# Patient Record
Sex: Female | Born: 1990 | Race: White | Marital: Single | Smoking: Never smoker
Health system: Southern US, Community
[De-identification: ages and names within clinical notes are randomized; demographics above are authoritative.]

---
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
## 2014-08-09 ENCOUNTER — Encounter: Payer: Self-pay | Admitting: Obstetrics & Gynecology

## 2014-08-09 ENCOUNTER — Other Ambulatory Visit: Payer: Self-pay | Admitting: Obstetrics & Gynecology

## 2014-08-09 ENCOUNTER — Ambulatory Visit (INDEPENDENT_AMBULATORY_CARE_PROVIDER_SITE_OTHER): Payer: Medicaid Other | Admitting: Obstetrics & Gynecology

## 2014-08-09 ENCOUNTER — Other Ambulatory Visit (HOSPITAL_COMMUNITY)
Admission: RE | Admit: 2014-08-09 | Discharge: 2014-08-09 | Disposition: A | Discharge status: Home / Self Care | Payer: Medicaid Other | Source: Ambulatory Visit | Attending: Obstetrics & Gynecology | Admitting: Obstetrics & Gynecology

## 2014-08-09 VITALS — BP 116/78 | HR 88 | Ht 62.0 in | Wt 183.0 lb

## 2014-08-09 DIAGNOSIS — Z113 Encounter for screening for infections with a predominantly sexual mode of transmission: Secondary | ICD-10-CM | POA: Diagnosis present

## 2014-08-09 DIAGNOSIS — E669 Obesity, unspecified: Secondary | ICD-10-CM

## 2014-08-09 DIAGNOSIS — Z01419 Encounter for gynecological examination (general) (routine) without abnormal findings: Secondary | ICD-10-CM | POA: Diagnosis present

## 2014-08-09 DIAGNOSIS — Z3401 Encounter for supervision of normal first pregnancy, first trimester: Secondary | ICD-10-CM

## 2014-08-09 DIAGNOSIS — Z3682 Encounter for antenatal screening for nuchal translucency: Secondary | ICD-10-CM

## 2014-08-09 DIAGNOSIS — O99210 Obesity complicating pregnancy, unspecified trimester: Secondary | ICD-10-CM | POA: Insufficient documentation

## 2014-08-09 NOTE — Progress Notes (Signed)
Bedside ultrasound show CRL of 8 wks 0 days.  This is consistent with LMP dating within 3 days.

## 2014-08-09 NOTE — Progress Notes (Signed)
   Subjective:    Crystal Gilbert is a SW G1P0 933w3d by her LMP being seen today for her first obstetrical visit.  Her obstetrical history is significant for obesity. Patient does intend to breast feed. Pregnancy history fully reviewed.  Patient reports nausea, occasional vomitting, some breast soreness.  Filed Vitals:   08/09/14 1054 08/09/14 1055  BP: 116/78   Pulse: 88   Height:  5\' 2"  (1.575 m)  Weight: 183 lb (83.008 kg)     HISTORY: OB History  Gravida Para Term Preterm AB SAB TAB Ectopic Multiple Living  1             # Outcome Date GA Lbr Len/2nd Weight Sex Delivery Anes PTL Lv  1 CUR              History reviewed. No pertinent past medical history. History reviewed. No pertinent past surgical history. Family History  Problem Relation Age of Onset  . Cancer Mother     breast  . Diabetes Mother   . Hypertension Mother   . Diabetes Father   . Hypertension Father      Exam    Uterus:     Pelvic Exam:    Perineum: No Hemorrhoids   Vulva: normal   Vagina:  normal mucosa   pH:    Cervix: anteverted   Adnexa: normal adnexa   Bony Pelvis: android  System: Breast:  normal appearance, no masses or tenderness   Skin: normal coloration and turgor, no rashes    Neurologic: oriented   Extremities: normal strength, tone, and muscle mass   HEENT PERRLA   Mouth/Teeth mucous membranes moist, pharynx normal without lesions   Neck supple   Cardiovascular: regular rate and rhythm   Respiratory:  appears well, vitals normal, no respiratory distress, acyanotic, normal RR, ear and throat exam is normal, neck free of mass or lymphadenopathy, chest clear, no wheezing, crepitations, rhonchi, normal symmetric air entry   Abdomen: soft, non-tender; bowel sounds normal; no masses,  no organomegaly   Urinary: urethral meatus normal      Assessment:    Pregnancy: G1P0 There are no active problems to display for this patient.       Plan:     Initial labs  drawn. Prenatal vitamins. Problem list reviewed and updated. Genetic Screening discussed First Screen: ordered.  Ultrasound discussed; fetal survey: requested.  Follow up in 4 weeks. Due to obesity, I will rec 25# weight gain, early glucola and she is willing to see a nutritionist. Kaylor Simenson C. 08/09/2014

## 2014-08-10 LAB — OBSTETRIC PANEL
Antibody Screen: NEGATIVE
Basophils Absolute: 0 10*3/uL (ref 0.0–0.1)
Basophils Relative: 0 % (ref 0–1)
Eosinophils Absolute: 0.1 10*3/uL (ref 0.0–0.7)
Eosinophils Relative: 1 % (ref 0–5)
HCT: 38.6 % (ref 36.0–46.0)
Hemoglobin: 13.5 g/dL (ref 12.0–15.0)
Hepatitis B Surface Ag: NEGATIVE
Lymphocytes Relative: 15 % (ref 12–46)
Lymphs Abs: 1.4 10*3/uL (ref 0.7–4.0)
MCH: 30.8 pg (ref 26.0–34.0)
MCHC: 35 g/dL (ref 30.0–36.0)
MCV: 88.1 fL (ref 78.0–100.0)
Monocytes Absolute: 0.7 10*3/uL (ref 0.1–1.0)
Monocytes Relative: 7 % (ref 3–12)
Neutro Abs: 7.2 10*3/uL (ref 1.7–7.7)
Neutrophils Relative %: 77 % (ref 43–77)
Platelets: 280 10*3/uL (ref 150–400)
RBC: 4.38 MIL/uL (ref 3.87–5.11)
RDW: 12.3 % (ref 11.5–15.5)
Rh Type: NEGATIVE
Rubella: 0.9 {index} — ABNORMAL HIGH (ref <0.90)
WBC: 9.4 10*3/uL (ref 4.0–10.5)

## 2014-08-10 LAB — HIV ANTIBODY (ROUTINE TESTING W REFLEX): HIV 1&2 Ab, 4th Generation: NONREACTIVE

## 2014-08-11 LAB — CULTURE, OB URINE: Colony Count: 3000

## 2014-08-13 LAB — CYTOLOGY - PAP

## 2014-09-06 ENCOUNTER — Ambulatory Visit (INDEPENDENT_AMBULATORY_CARE_PROVIDER_SITE_OTHER): Payer: Medicaid Other | Admitting: Obstetrics & Gynecology

## 2014-09-06 ENCOUNTER — Ambulatory Visit (HOSPITAL_COMMUNITY)
Admission: RE | Admit: 2014-09-06 | Discharge: 2014-09-06 | Disposition: A | Discharge status: Home / Self Care | Payer: Medicaid Other | Source: Ambulatory Visit | Attending: Obstetrics & Gynecology | Admitting: Obstetrics & Gynecology

## 2014-09-06 ENCOUNTER — Encounter (HOSPITAL_COMMUNITY): Payer: Self-pay

## 2014-09-06 VITALS — BP 119/64 | HR 79 | Wt 185.2 lb

## 2014-09-06 VITALS — BP 125/79 | HR 91 | Wt 185.0 lb

## 2014-09-06 DIAGNOSIS — Z3401 Encounter for supervision of normal first pregnancy, first trimester: Secondary | ICD-10-CM

## 2014-09-06 DIAGNOSIS — Z3A12 12 weeks gestation of pregnancy: Secondary | ICD-10-CM | POA: Diagnosis not present

## 2014-09-06 DIAGNOSIS — Z3682 Encounter for antenatal screening for nuchal translucency: Secondary | ICD-10-CM

## 2014-09-06 DIAGNOSIS — Z36 Encounter for antenatal screening of mother: Secondary | ICD-10-CM | POA: Diagnosis not present

## 2014-09-06 IMAGING — US US MFM FETAL NUCHAL TRANSLUCENCY
1 series · 13 of 27 positions shown · non-contrast
Comparison: none

[Series 1: us mfm fetal nuchal translucency · 0.21mm/px · 13 of 27 slices shown]
[im 2/27]
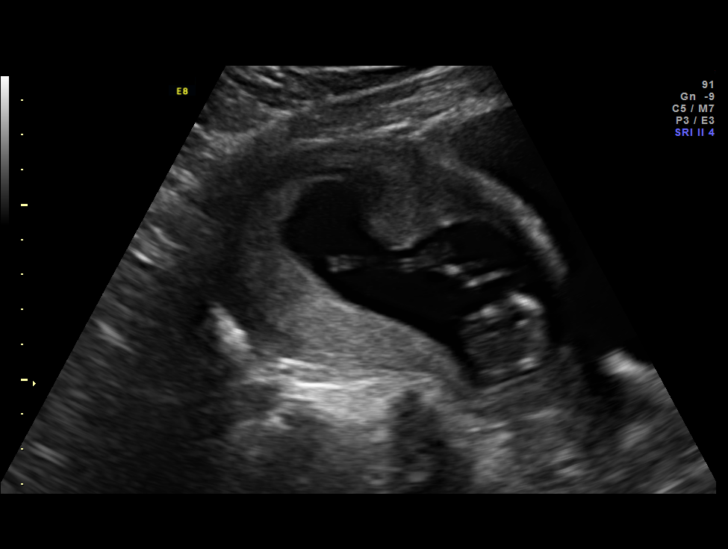
[im 4/27]
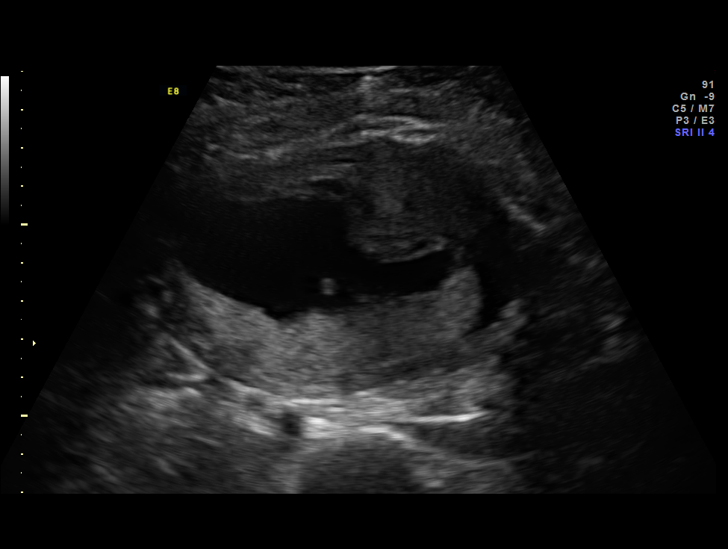
[im 6/27]
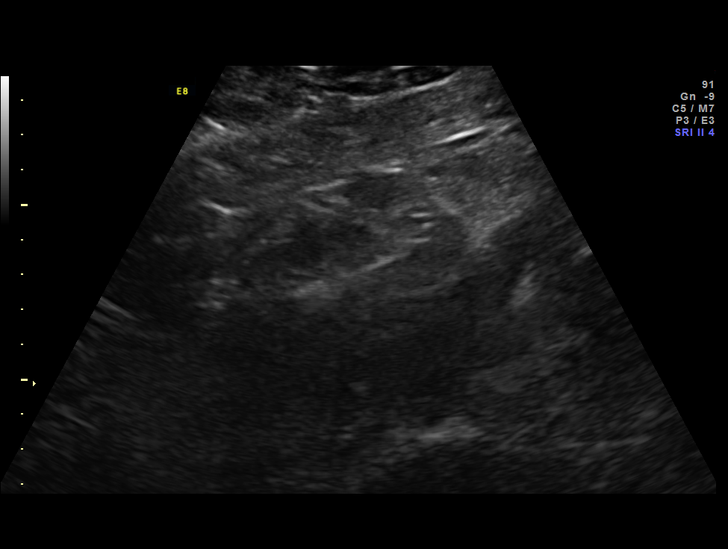
[im 8/27]
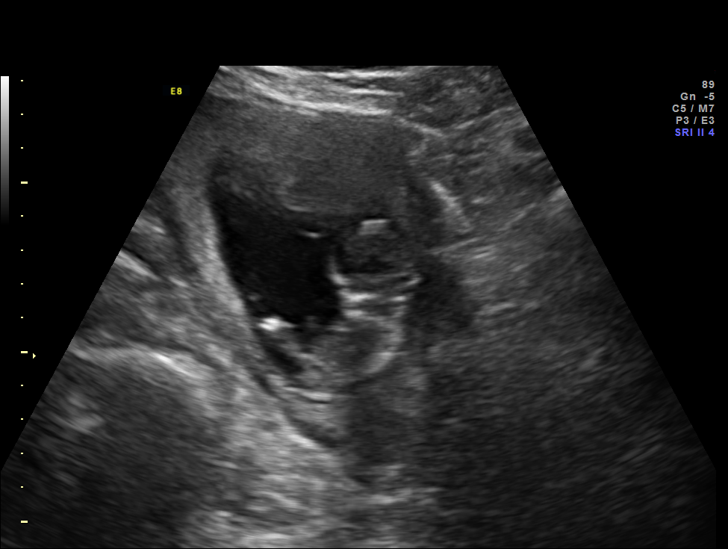
[im 10/27]
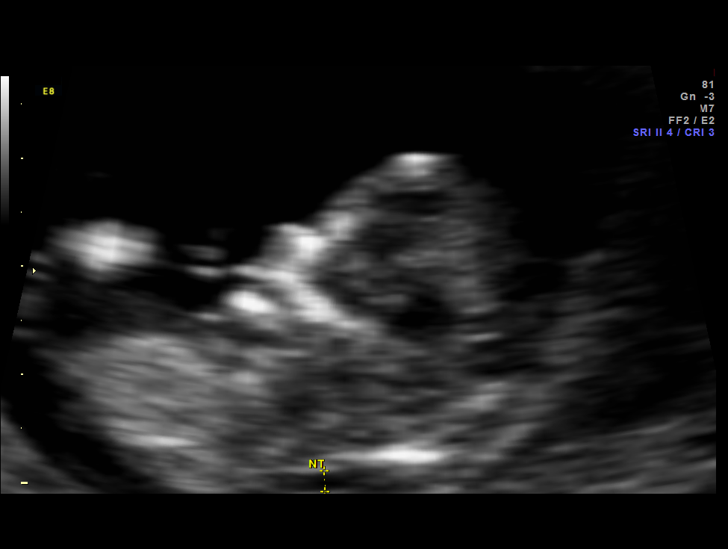
[im 12/27]
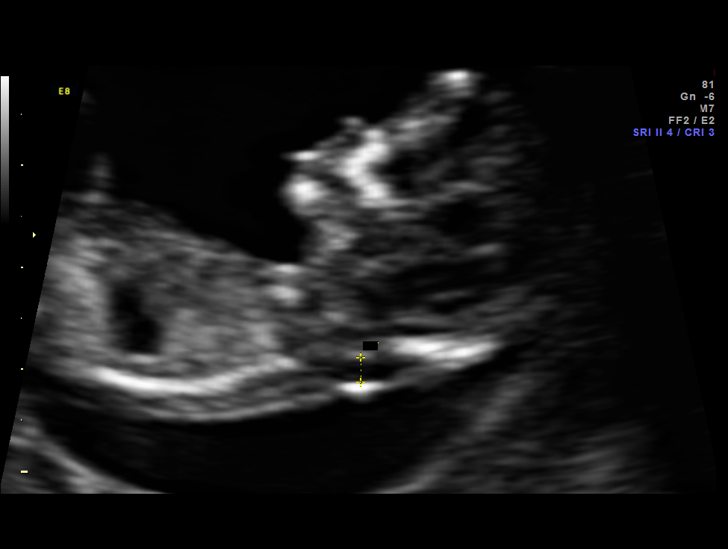
[im 14/27]
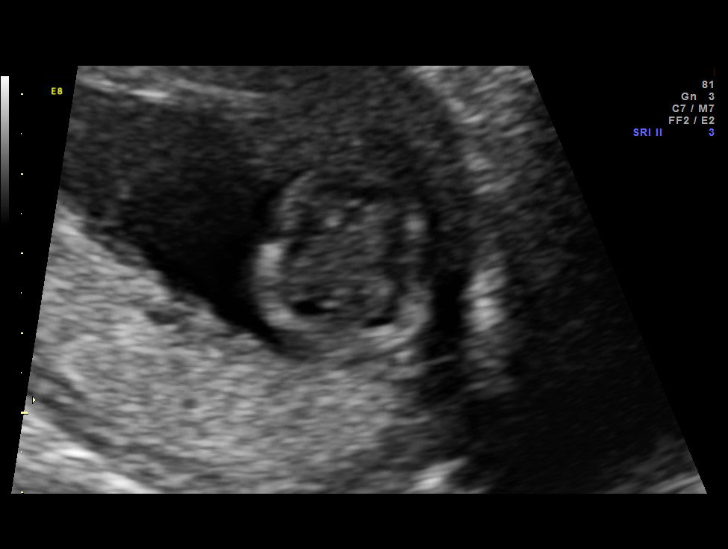
[im 16/27]
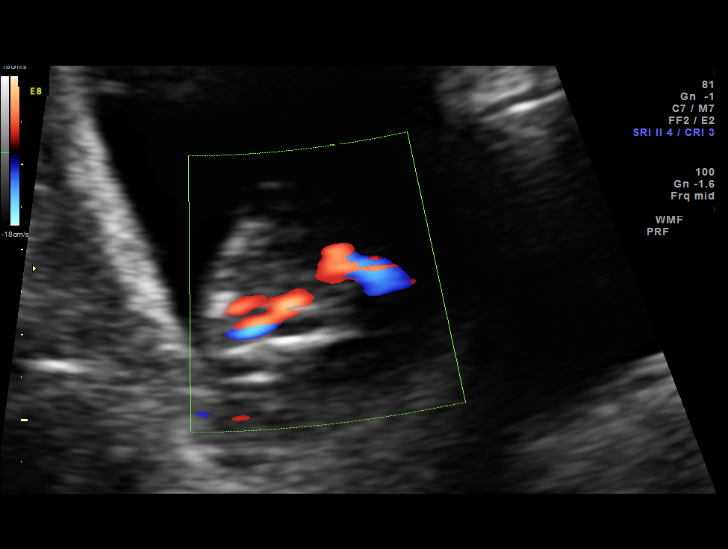
[im 18/27]
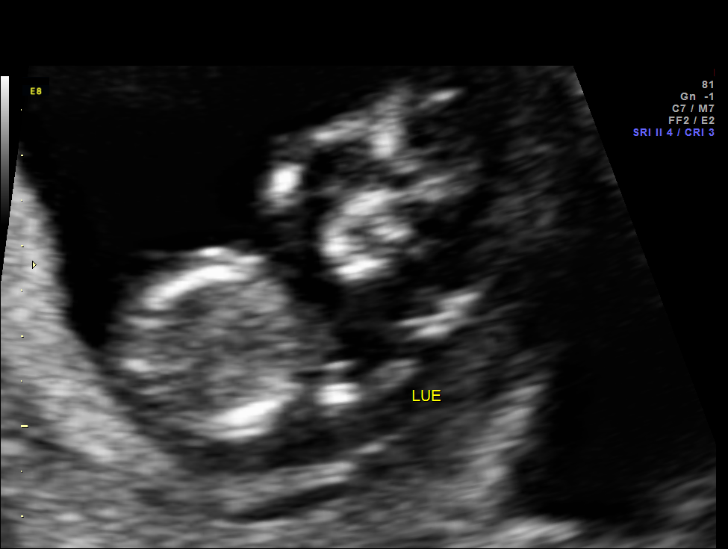
[im 20/27]
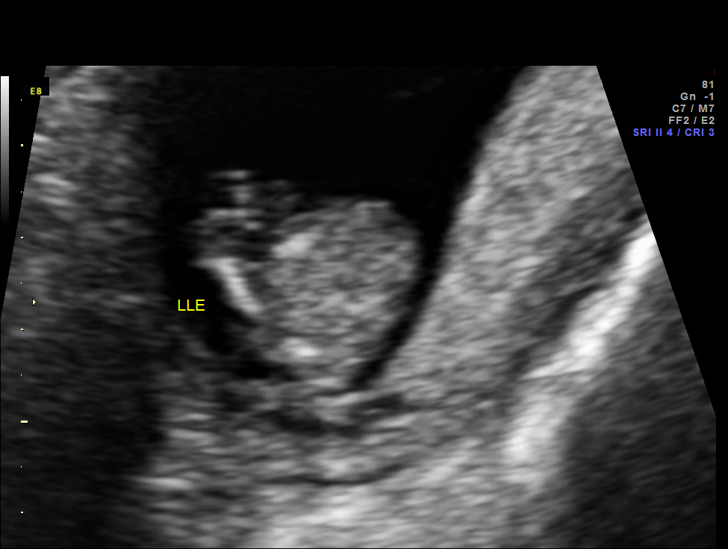
[im 22/27]
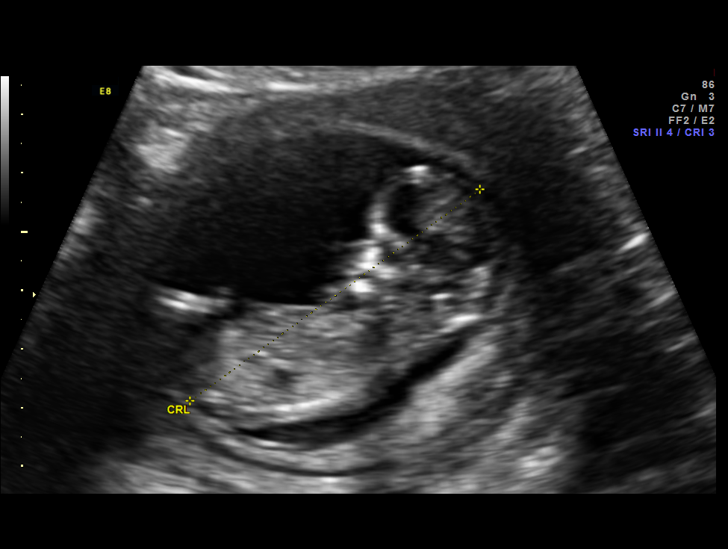
[im 24/27]
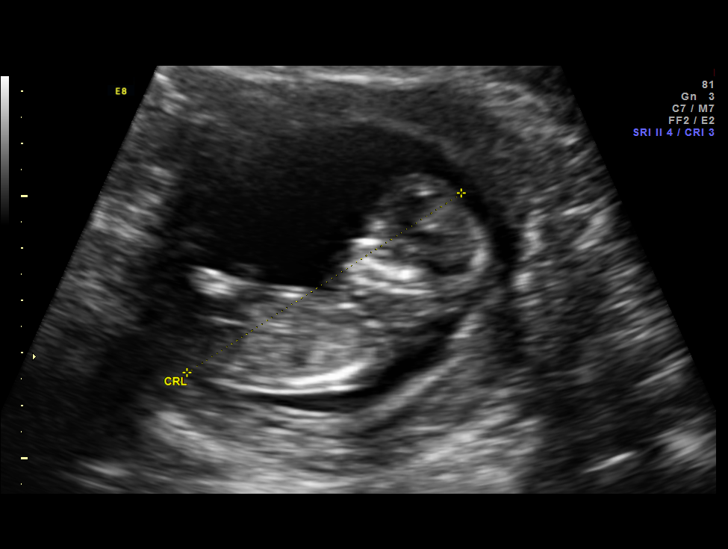
[im 26/27]
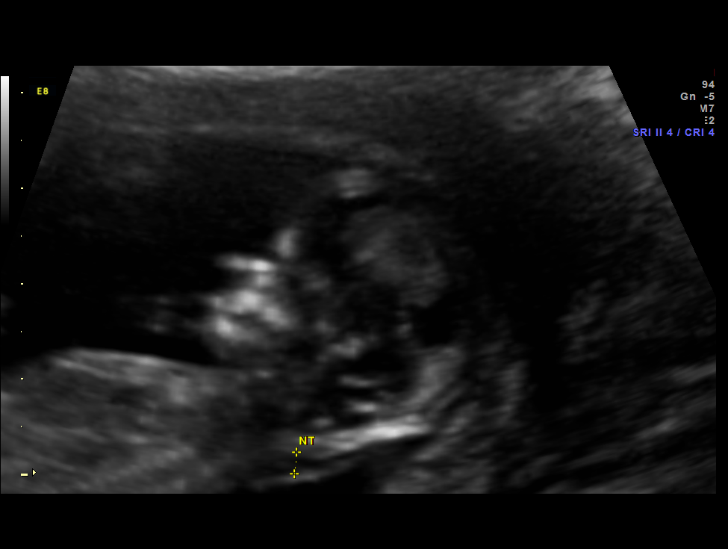

[13 of 27 positions shown; findings below may reference images not displayed]

OBSTETRICS REPORT
                      (Signed Final [DATE] [DATE])

Service(s) Provided

Indications

 First trimester aneuploidy screen (NT)                Z36
 12 weeks gestation of pregnancy
Fetal Evaluation

 Num Of Fetuses:    1
 Fetal Heart Rate:  145                          bpm
 Cardiac Activity:  Observed
 Presentation:      Transverse, head to
                    maternal left
 Placenta:          Posterior

 Amniotic Fluid
 AFI FV:      Subjectively within normal limits
Gestational Age

 LMP:           12w 3d        Date:  [DATE]                 EDD:   [DATE]
 Best:          12w 3d     Det. By:  LMP  ([DATE])          EDD:   [DATE]
1st Trimester Genetic Sonogram Screening

 CRL:            62.4  mm    G. Age:   12w 3d                 EDD:   [DATE]
 Nuc Trans:       2.3  mm

 Nasal Bone:                 Present
Anatomy

 Cranium:          Appears normal         Cord Vessels:     Appears normal (3
                                                            vessel cord)
 Choroid Plexus:   Appears normal         Lower             Visualized
                                          Extremities:
 Stomach:          Appears normal, left   Upper             Visualized
                   sided                  Extremities:
Cervix Uterus Adnexa

 Cervix:       Normal appearance by transabdominal scan.

 Adnexa:     No abnormality visualized.
Impression

 Single IUP at 12w 3d
 NT 2.3 mm.  Nasal bone visualized.
 First trimester aneuploidy screen performed as noted above.
Recommendations

 Please do not draw triple/quad screen, though patient should
 be offered [facility]SAFP for neural tube defect screening.
 Recommend ultrasound for fetal anatomy at 18-20 weeks
 gestaton

 questions or concerns.

## 2014-09-06 NOTE — Progress Notes (Signed)
Had two days of brown discharge around 10 wks, some lower back pain now.  Negative urine dip.

## 2014-09-06 NOTE — Progress Notes (Signed)
First trimester screen done 10/30, will follow up results and manage accordingly. Patient is moving to [place], has not found OB provider yet.  Will transfer records once she gets new OB provider.  For now, she will continue coming here. AFP only draw next visit.  Routine obstetric precautions reviewed.

## 2014-09-06 NOTE — Patient Instructions (Signed)
Return to clinic for any obstetric concerns or go to [facility] for evaluation  

## 2014-09-10 ENCOUNTER — Encounter (HOSPITAL_COMMUNITY): Payer: Self-pay

## 2014-09-26 ENCOUNTER — Ambulatory Visit: Payer: Medicaid Other | Admitting: Dietician

## 2014-10-01 ENCOUNTER — Other Ambulatory Visit (HOSPITAL_COMMUNITY): Payer: Self-pay

## 2014-10-11 ENCOUNTER — Ambulatory Visit (INDEPENDENT_AMBULATORY_CARE_PROVIDER_SITE_OTHER): Payer: Medicaid Other | Admitting: Family Medicine

## 2014-10-11 VITALS — BP 110/76 | HR 102 | Wt 187.2 lb

## 2014-10-11 DIAGNOSIS — O360121 Maternal care for anti-D [Rh] antibodies, second trimester, fetus 1: Secondary | ICD-10-CM

## 2014-10-11 DIAGNOSIS — Z6791 Unspecified blood type, Rh negative: Secondary | ICD-10-CM | POA: Insufficient documentation

## 2014-10-11 DIAGNOSIS — Z3401 Encounter for supervision of normal first pregnancy, first trimester: Secondary | ICD-10-CM

## 2014-10-11 DIAGNOSIS — O26899 Other specified pregnancy related conditions, unspecified trimester: Secondary | ICD-10-CM

## 2014-10-11 NOTE — Progress Notes (Signed)
Came back from [place] for PNV. Is having a hard time finding a provider in [place]. She is Rh negative--needs Rhogam Schedule anatomy

## 2014-10-11 NOTE — Patient Instructions (Signed)
Second Trimester of Pregnancy The second trimester is from week 13 through week 28, months 4 through 6. The second trimester is often a time when you feel your best. Your body has also adjusted to being pregnant, and you begin to feel better physically. Usually, morning sickness has lessened or quit completely, you may have more energy, and you may have an increase in appetite. The second trimester is also a time when the fetus is growing rapidly. At the end of the sixth month, the fetus is about 9 inches long and weighs about 1 pounds. You will likely begin to feel the baby move (quickening) between 18 and 20 weeks of the pregnancy. BODY CHANGES Your body goes through many changes during pregnancy. The changes vary from woman to woman.   Your weight will continue to increase. You will notice your lower abdomen bulging out.  You may begin to get stretch marks on your hips, abdomen, and breasts.  You may develop headaches that can be relieved by medicines approved by your health care provider.  You may urinate more often because the fetus is pressing on your bladder.  You may develop or continue to have heartburn as a result of your pregnancy.  You may develop constipation because certain hormones are causing the muscles that push waste through your intestines to slow down.  You may develop hemorrhoids or swollen, bulging veins (varicose veins).  You may have back pain because of the weight gain and pregnancy hormones relaxing your joints between the bones in your pelvis and as a result of a shift in weight and the muscles that support your balance.  Your breasts will continue to grow and be tender.  Your gums may bleed and may be sensitive to brushing and flossing.  Dark spots or blotches (chloasma, mask of pregnancy) may develop on your face. This will likely fade after the baby is born.  A dark line from your belly button to the pubic area (linea nigra) may appear. This will likely  fade after the baby is born.  You may have changes in your hair. These can include thickening of your hair, rapid growth, and changes in texture. Some women also have hair loss during or after pregnancy, or hair that feels dry or thin. Your hair will most likely return to normal after your baby is born. WHAT TO EXPECT AT YOUR PRENATAL VISITS During a routine prenatal visit:  You will be weighed to make sure you and the fetus are growing normally.  Your blood pressure will be taken.  Your abdomen will be measured to track your baby's growth.  The fetal heartbeat will be listened to.  Any test results from the previous visit will be discussed. Your health care provider may ask you:  How you are feeling.  If you are feeling the baby move.  If you have had any abnormal symptoms, such as leaking fluid, bleeding, severe headaches, or abdominal cramping.  If you have any questions. Other tests that may be performed during your second trimester include:  Blood tests that check for:  Low iron levels (anemia).  Gestational diabetes (between 24 and 28 weeks).  Rh antibodies.  Urine tests to check for infections, diabetes, or protein in the urine.  An ultrasound to confirm the proper growth and development of the baby.  An amniocentesis to check for possible genetic problems.  Fetal screens for spina bifida and Down syndrome. HOME CARE INSTRUCTIONS   Avoid all smoking, herbs, alcohol, and unprescribed   drugs. These chemicals affect the formation and growth of the baby.  Follow your health care provider's instructions regarding medicine use. There are medicines that are either safe or unsafe to take during pregnancy.  Exercise only as directed by your health care provider. Experiencing uterine cramps is a good sign to stop exercising.  Continue to eat regular, healthy meals.  Wear a good support bra for breast tenderness.  Do not use hot tubs, steam rooms, or saunas.  Wear  your seat belt at all times when driving.  Avoid raw meat, uncooked cheese, cat litter boxes, and soil used by cats. These carry germs that can cause birth defects in the baby.  Take your prenatal vitamins.  Try taking a stool softener (if your health care provider approves) if you develop constipation. Eat more high-fiber foods, such as fresh vegetables or fruit and whole grains. Drink plenty of fluids to keep your urine clear or pale yellow.  Take warm sitz baths to soothe any pain or discomfort caused by hemorrhoids. Use hemorrhoid cream if your health care provider approves.  If you develop varicose veins, wear support hose. Elevate your feet for 15 minutes, 3-4 times a day. Limit salt in your diet.  Avoid heavy lifting, wear low heel shoes, and practice good posture.  Rest with your legs elevated if you have leg cramps or low back pain.  Visit your dentist if you have not gone yet during your pregnancy. Use a soft toothbrush to brush your teeth and be gentle when you floss.  A sexual relationship may be continued unless your health care provider directs you otherwise.  Continue to go to all your prenatal visits as directed by your health care provider. SEEK MEDICAL CARE IF:   You have dizziness.  You have mild pelvic cramps, pelvic pressure, or nagging pain in the abdominal area.  You have persistent nausea, vomiting, or diarrhea.  You have a bad smelling vaginal discharge.  You have pain with urination. SEEK IMMEDIATE MEDICAL CARE IF:   You have a fever.  You are leaking fluid from your vagina.  You have spotting or bleeding from your vagina.  You have severe abdominal cramping or pain.  You have rapid weight gain or loss.  You have shortness of breath with chest pain.  You notice sudden or extreme swelling of your face, hands, ankles, feet, or legs.  You have not felt your baby move in over an hour.  You have severe headaches that do not go away with  medicine.  You have vision changes. Document Released: 10/19/2001 Document Revised: 10/30/2013 Document Reviewed: 12/26/2012 [employer] Patient Information 2015 [employer]. This information is not intended to replace advice given to you by your health care provider. Make sure you discuss any questions you have with your health care provider.  Breastfeeding Deciding to breastfeed is one of the best choices you can make for you and your baby. A change in hormones during pregnancy causes your breast tissue to grow and increases the number and size of your milk ducts. These hormones also allow proteins, sugars, and fats from your blood supply to make breast milk in your milk-producing glands. Hormones prevent breast milk from being released before your baby is born as well as prompt milk flow after birth. Once breastfeeding has begun, thoughts of your baby, as well as his or her sucking or crying, can stimulate the release of milk from your milk-producing glands.  BENEFITS OF BREASTFEEDING For Your Baby  Your first   milk (colostrum) helps your baby's digestive system function better.   There are antibodies in your milk that help your baby fight off infections.   Your baby has a lower incidence of asthma, allergies, and sudden infant death syndrome.   The nutrients in breast milk are better for your baby than infant formulas and are designed uniquely for your baby's needs.   Breast milk improves your baby's brain development.   Your baby is less likely to develop other conditions, such as childhood obesity, asthma, or type 2 diabetes mellitus.  For You   Breastfeeding helps to create a very special bond between you and your baby.   Breastfeeding is convenient. Breast milk is always available at the correct temperature and costs nothing.   Breastfeeding helps to burn calories and helps you lose the weight gained during pregnancy.   Breastfeeding makes your uterus contract to its  prepregnancy size faster and slows bleeding (lochia) after you give birth.   Breastfeeding helps to lower your risk of developing type 2 diabetes mellitus, osteoporosis, and breast or ovarian cancer later in life. SIGNS THAT YOUR BABY IS HUNGRY Early Signs of Hunger  Increased alertness or activity.  Stretching.  Movement of the head from side to side.  Movement of the head and opening of the mouth when the corner of the mouth or cheek is stroked (rooting).  Increased sucking sounds, smacking lips, cooing, sighing, or squeaking.  Hand-to-mouth movements.  Increased sucking of fingers or hands. Late Signs of Hunger  Fussing.  Intermittent crying. Extreme Signs of Hunger Signs of extreme hunger will require calming and consoling before your baby will be able to breastfeed successfully. Do not wait for the following signs of extreme hunger to occur before you initiate breastfeeding:   Restlessness.  A loud, strong cry.   Screaming. BREASTFEEDING BASICS Breastfeeding Initiation  Find a comfortable place to sit or lie down, with your neck and back well supported.  Place a pillow or rolled up blanket under your baby to bring him or her to the level of your breast (if you are seated). Nursing pillows are specially designed to help support your arms and your baby while you breastfeed.  Make sure that your baby's abdomen is facing your abdomen.   Gently massage your breast. With your fingertips, massage from your chest wall toward your nipple in a circular motion. This encourages milk flow. You may need to continue this action during the feeding if your milk flows slowly.  Support your breast with 4 fingers underneath and your thumb above your nipple. Make sure your fingers are well away from your nipple and your baby's mouth.   Stroke your baby's lips gently with your finger or nipple.   When your baby's mouth is open wide enough, quickly bring your baby to your breast,  placing your entire nipple and as much of the colored area around your nipple (areola) as possible into your baby's mouth.   More areola should be visible above your baby's upper lip than below the lower lip.   Your baby's tongue should be between his or her lower gum and your breast.   Ensure that your baby's mouth is correctly positioned around your nipple (latched). Your baby's lips should create a seal on your breast and be turned out (everted).  It is common for your baby to suck about 2-3 minutes in order to start the flow of breast milk. Latching Teaching your baby how to latch on to your breast   properly is very important. An improper latch can cause nipple pain and decreased milk supply for you and poor weight gain in your baby. Also, if your baby is not latched onto your nipple properly, he or she may swallow some air during feeding. This can make your baby fussy. Burping your baby when you switch breasts during the feeding can help to get rid of the air. However, teaching your baby to latch on properly is still the best way to prevent fussiness from swallowing air while breastfeeding. Signs that your baby has successfully latched on to your nipple:    Silent tugging or silent sucking, without causing you pain.   Swallowing heard between every 3-4 sucks.    Muscle movement above and in front of his or her ears while sucking.  Signs that your baby has not successfully latched on to nipple:   Sucking sounds or smacking sounds from your baby while breastfeeding.  Nipple pain. If you think your baby has not latched on correctly, slip your finger into the corner of your baby's mouth to break the suction and place it between your baby's gums. Attempt breastfeeding initiation again. Signs of Successful Breastfeeding Signs from your baby:   A gradual decrease in the number of sucks or complete cessation of sucking.   Falling asleep.   Relaxation of his or her body.    Retention of a small amount of milk in his or her mouth.   Letting go of your breast by himself or herself. Signs from you:  Breasts that have increased in firmness, weight, and size 1-3 hours after feeding.   Breasts that are softer immediately after breastfeeding.  Increased milk volume, as well as a change in milk consistency and color by the fifth day of breastfeeding.   Nipples that are not sore, cracked, or bleeding. Signs That Your Baby is Getting Enough Milk  Wetting at least 3 diapers in a 24-hour period. The urine should be clear and pale yellow by age 5 days.  At least 3 stools in a 24-hour period by age 5 days. The stool should be soft and yellow.  At least 3 stools in a 24-hour period by age 7 days. The stool should be seedy and yellow.  No loss of weight greater than 10% of birth weight during the first 3 days of age.  Average weight gain of 4-7 ounces (113-198 g) per week after age 4 days.  Consistent daily weight gain by age 5 days, without weight loss after the age of 2 weeks. After a feeding, your baby may spit up a small amount. This is common. BREASTFEEDING FREQUENCY AND DURATION Frequent feeding will help you make more milk and can prevent sore nipples and breast engorgement. Breastfeed when you feel the need to reduce the fullness of your breasts or when your baby shows signs of hunger. This is called "breastfeeding on demand." Avoid introducing a pacifier to your baby while you are working to establish breastfeeding (the first 4-6 weeks after your baby is born). After this time you may choose to use a pacifier. Research has shown that pacifier use during the first year of a baby's life decreases the risk of sudden infant death syndrome (SIDS). Allow your baby to feed on each breast as long as he or she wants. Breastfeed until your baby is finished feeding. When your baby unlatches or falls asleep while feeding from the first breast, offer the second breast.  Because newborns are often sleepy in the   first few weeks of life, you may need to awaken your baby to get him or her to feed. Breastfeeding times will vary from baby to baby. However, the following rules can serve as a guide to help you ensure that your baby is properly fed:  Newborns (babies 4 weeks of age or younger) may breastfeed every 1-3 hours.  Newborns should not go longer than 3 hours during the day or 5 hours during the night without breastfeeding.  You should breastfeed your baby a minimum of 8 times in a 24-hour period until you begin to introduce solid foods to your baby at around 6 months of age. BREAST MILK PUMPING Pumping and storing breast milk allows you to ensure that your baby is exclusively fed your breast milk, even at times when you are unable to breastfeed. This is especially important if you are going back to work while you are still breastfeeding or when you are not able to be present during feedings. Your lactation consultant can give you guidelines on how long it is safe to store breast milk.  A breast pump is a machine that allows you to pump milk from your breast into a sterile bottle. The pumped breast milk can then be stored in a refrigerator or freezer. Some breast pumps are operated by hand, while others use electricity. Ask your lactation consultant which type will work best for you. Breast pumps can be purchased, but some hospitals and breastfeeding support groups lease breast pumps on a monthly basis. A lactation consultant can teach you how to hand express breast milk, if you prefer not to use a pump.  CARING FOR YOUR BREASTS WHILE YOU BREASTFEED Nipples can become dry, cracked, and sore while breastfeeding. The following recommendations can help keep your breasts moisturized and healthy:  Avoid using soap on your nipples.   Wear a supportive bra. Although not required, special nursing bras and tank tops are designed to allow access to your breasts for  breastfeeding without taking off your entire bra or top. Avoid wearing underwire-style bras or extremely tight bras.  Air dry your nipples for 3-4minutes after each feeding.   Use only cotton bra pads to absorb leaked breast milk. Leaking of breast milk between feedings is normal.   Use lanolin on your nipples after breastfeeding. Lanolin helps to maintain your skin's normal moisture barrier. If you use pure lanolin, you do not need to wash it off before feeding your baby again. Pure lanolin is not toxic to your baby. You may also hand express a few drops of breast milk and gently massage that milk into your nipples and allow the milk to air dry. In the first few weeks after giving birth, some women experience extremely full breasts (engorgement). Engorgement can make your breasts feel heavy, warm, and tender to the touch. Engorgement peaks within 3-5 days after you give birth. The following recommendations can help ease engorgement:  Completely empty your breasts while breastfeeding or pumping. You may want to start by applying warm, moist heat (in the shower or with warm water-soaked hand towels) just before feeding or pumping. This increases circulation and helps the milk flow. If your baby does not completely empty your breasts while breastfeeding, pump any extra milk after he or she is finished.  Wear a snug bra (nursing or regular) or tank top for 1-2 days to signal your body to slightly decrease milk production.  Apply ice packs to your breasts, unless this is too uncomfortable for you.    Make sure that your baby is latched on and positioned properly while breastfeeding. If engorgement persists after 48 hours of following these recommendations, contact your health care provider or a lactation consultant. OVERALL HEALTH CARE RECOMMENDATIONS WHILE BREASTFEEDING  Eat healthy foods. Alternate between meals and snacks, eating 3 of each per day. Because what you eat affects your breast milk,  some of the foods may make your baby more irritable than usual. Avoid eating these foods if you are sure that they are negatively affecting your baby.  Drink milk, fruit juice, and water to satisfy your thirst (about 10 glasses a day).   Rest often, relax, and continue to take your prenatal vitamins to prevent fatigue, stress, and anemia.  Continue breast self-awareness checks.  Avoid chewing and smoking tobacco.  Avoid alcohol and drug use. Some medicines that may be harmful to your baby can pass through breast milk. It is important to ask your health care provider before taking any medicine, including all over-the-counter and prescription medicine as well as vitamin and herbal supplements. It is possible to become pregnant while breastfeeding. If birth control is desired, ask your health care provider about options that will be safe for your baby. SEEK MEDICAL CARE IF:   You feel like you want to stop breastfeeding or have become frustrated with breastfeeding.  You have painful breasts or nipples.  Your nipples are cracked or bleeding.  Your breasts are red, tender, or warm.  You have a swollen area on either breast.  You have a fever or chills.  You have nausea or vomiting.  You have drainage other than breast milk from your nipples.  Your breasts do not become full before feedings by the fifth day after you give birth.  You feel sad and depressed.  Your baby is too sleepy to eat well.  Your baby is having trouble sleeping.   Your baby is wetting less than 3 diapers in a 24-hour period.  Your baby has less than 3 stools in a 24-hour period.  Your baby's skin or the white part of his or her eyes becomes yellow.   Your baby is not gaining weight by 5 days of age. SEEK IMMEDIATE MEDICAL CARE IF:   Your baby is overly tired (lethargic) and does not want to wake up and feed.  Your baby develops an unexplained fever. Document Released: 10/25/2005 Document Revised:  10/30/2013 Document Reviewed: 04/18/2013 [employer] Patient Information 2015 [employer]. This information is not intended to replace advice given to you by your health care provider. Make sure you discuss any questions you have with your health care provider.  

## 2014-10-15 LAB — ALPHA FETOPROTEIN, MATERNAL
AFP: 68.5 ng/mL
Curr Gest Age: 17.3 wks.days
MoM for AFP: 1.92
Open Spina bifida: NEGATIVE
Osb Risk: 1:916 {titer}

## 2014-10-23 ENCOUNTER — Ambulatory Visit (HOSPITAL_COMMUNITY): Payer: Medicaid Other

## 2014-10-31 ENCOUNTER — Ambulatory Visit (HOSPITAL_COMMUNITY)
Admission: RE | Admit: 2014-10-31 | Discharge: 2014-10-31 | Disposition: A | Discharge status: Home / Self Care | Payer: Medicaid Other | Source: Ambulatory Visit | Attending: Family Medicine | Admitting: Family Medicine

## 2014-10-31 ENCOUNTER — Other Ambulatory Visit: Payer: Self-pay | Admitting: Family Medicine

## 2014-10-31 DIAGNOSIS — Z3401 Encounter for supervision of normal first pregnancy, first trimester: Secondary | ICD-10-CM

## 2014-11-03 DIAGNOSIS — Z3A20 20 weeks gestation of pregnancy: Secondary | ICD-10-CM | POA: Insufficient documentation

## 2014-11-03 DIAGNOSIS — Z1389 Encounter for screening for other disorder: Secondary | ICD-10-CM | POA: Insufficient documentation

## 2014-11-03 DIAGNOSIS — Z363 Encounter for antenatal screening for malformations: Secondary | ICD-10-CM | POA: Insufficient documentation

## 2015-06-14 ENCOUNTER — Encounter (HOSPITAL_COMMUNITY): Payer: Self-pay | Admitting: *Deleted
